# Patient Record
Sex: Female | Born: 1940 | Race: White | Hispanic: No
Health system: Southern US, Community
[De-identification: ages and names within clinical notes are randomized; demographics above are authoritative.]

---

## 2005-02-28 ENCOUNTER — Emergency Department: Payer: Self-pay | Admitting: Emergency Medicine

## 2005-03-13 ENCOUNTER — Other Ambulatory Visit: Payer: Self-pay

## 2005-03-13 ENCOUNTER — Emergency Department: Payer: Self-pay | Admitting: Emergency Medicine

## 2005-04-15 ENCOUNTER — Ambulatory Visit: Payer: Self-pay | Admitting: *Deleted

## 2005-04-19 ENCOUNTER — Ambulatory Visit: Payer: Self-pay | Admitting: Internal Medicine

## 2005-05-04 ENCOUNTER — Emergency Department: Payer: Self-pay | Admitting: Emergency Medicine

## 2005-05-20 ENCOUNTER — Ambulatory Visit: Payer: Self-pay | Admitting: *Deleted

## 2005-07-18 ENCOUNTER — Ambulatory Visit (HOSPITAL_COMMUNITY): Admission: RE | Admit: 2005-07-18 | Discharge: 2005-07-19 | Payer: Self-pay | Admitting: Cardiology

## 2005-07-27 ENCOUNTER — Encounter: Payer: Self-pay | Admitting: Anesthesiology

## 2005-08-11 ENCOUNTER — Encounter: Payer: Self-pay | Admitting: Anesthesiology

## 2005-08-18 ENCOUNTER — Inpatient Hospital Stay (HOSPITAL_COMMUNITY): Admission: RE | Admit: 2005-08-18 | Discharge: 2005-08-20 | Payer: Self-pay | Admitting: Cardiology

## 2007-07-31 IMAGING — CT CT HEAD W/O CM
1 series · 15 of 30 positions shown, 19 images · IV contrast (agent unspecified)
Comparison: none

CLINICAL DATA: Code stroke patient.  
HEAD CT WITHOUT CONTRAST:
TECHNIQUE: Contiguous axial images were obtained from the base of the skull through the vertex according to standard protocol without contrast.

[Series 2: headseq 4.8 h45s · axial · 0.42mm/px · z∈[-146,-16]mm · 15 of 30 slices shown, 19 images]
[im 2/30  brain]
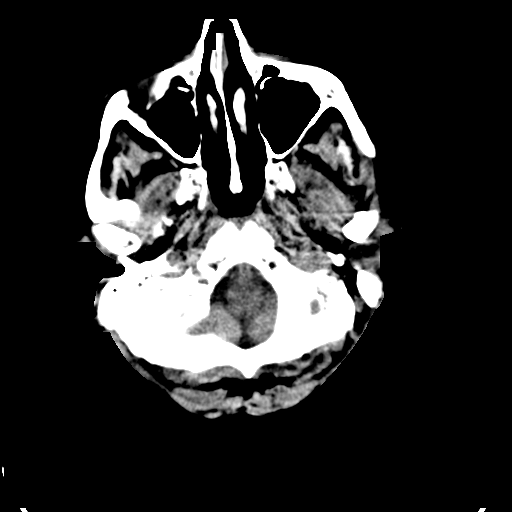
[im 2/30  bone]
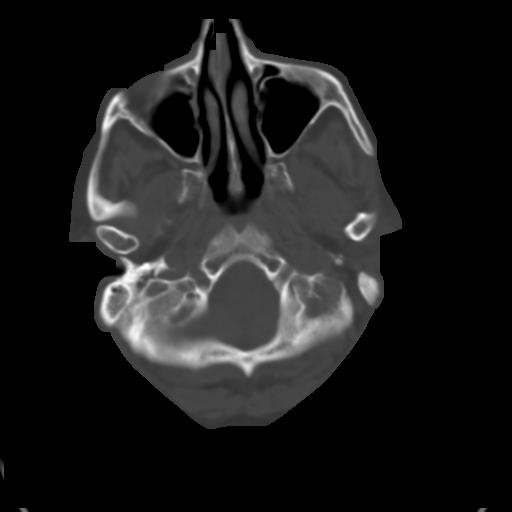
[im 4/30  brain]
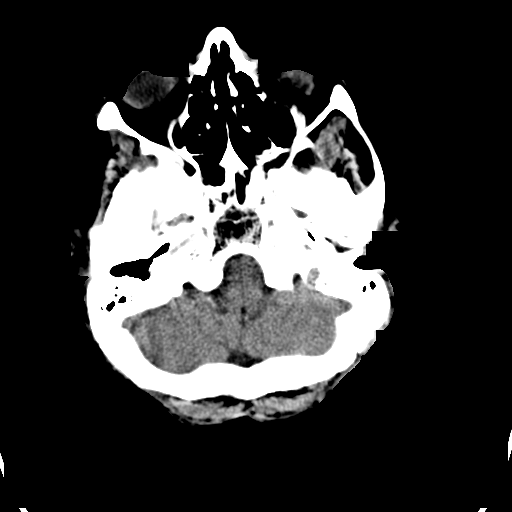
[im 6/30  brain]
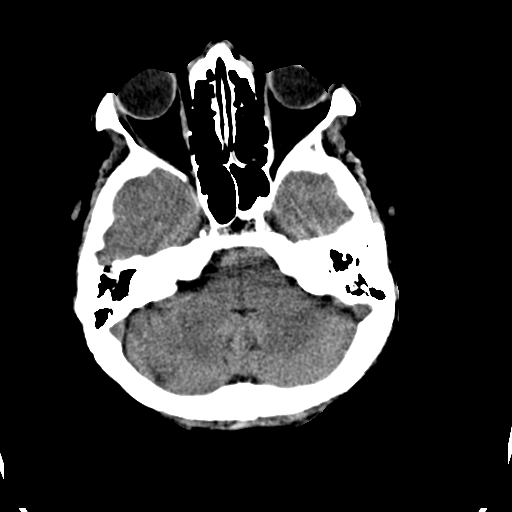
[im 8/30  brain]
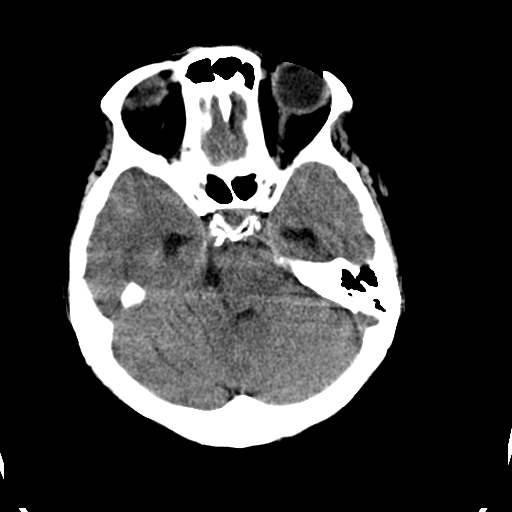
[im 10/30  brain]
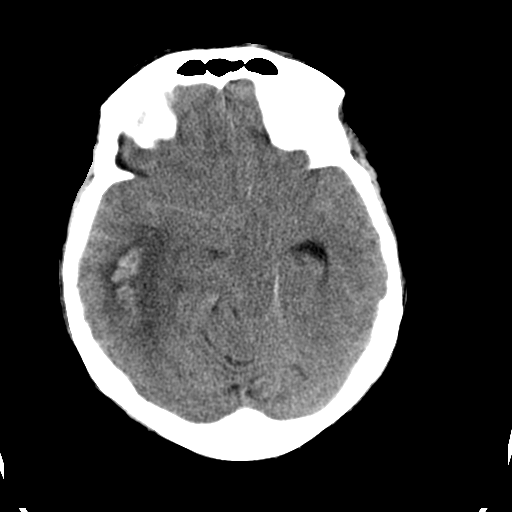
[im 10/30  bone]
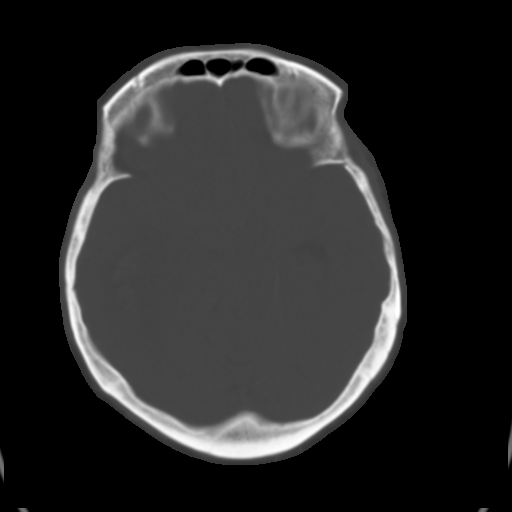
[im 12/30  brain]
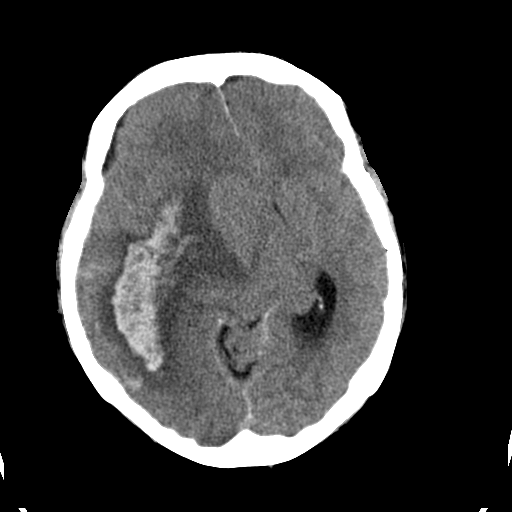
[im 14/30  brain]
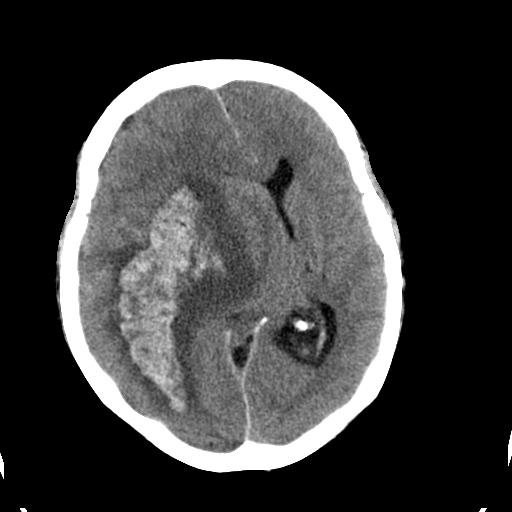
[im 16/30  brain]
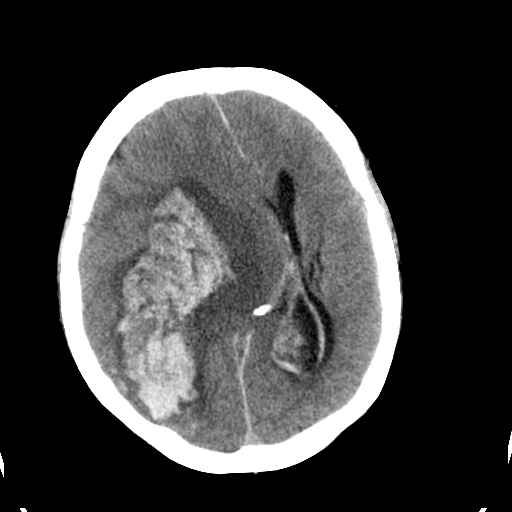
[im 17/30  brain]
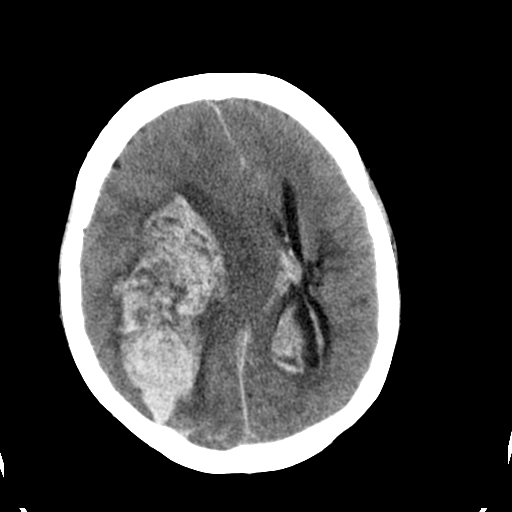
[im 17/30  bone]
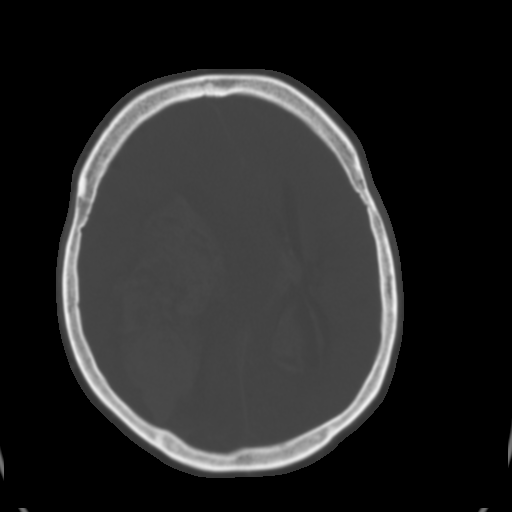
[im 19/30  brain]
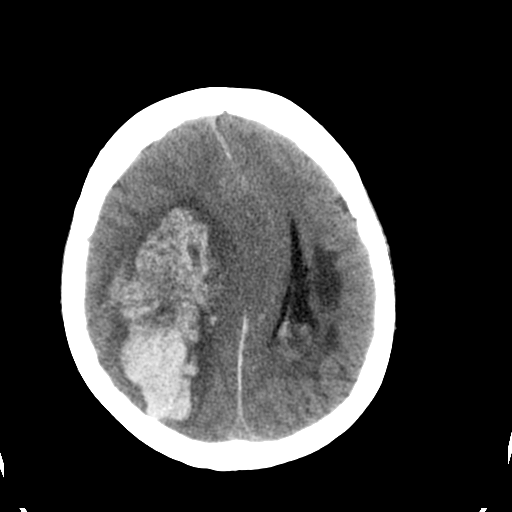
[im 21/30  brain]
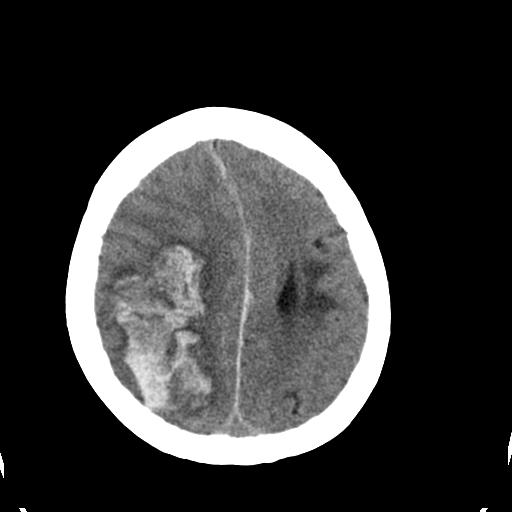
[im 23/30  brain]
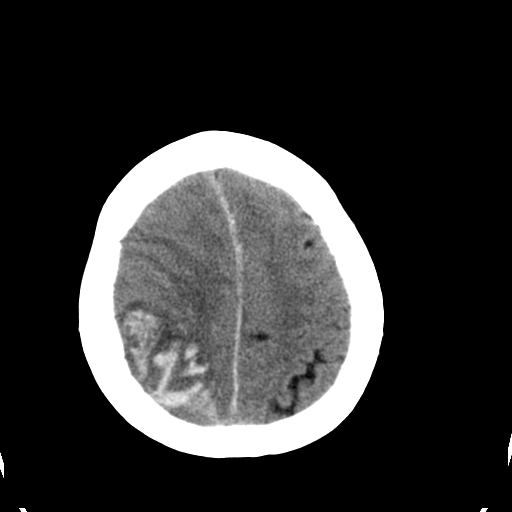
[im 25/30  brain]
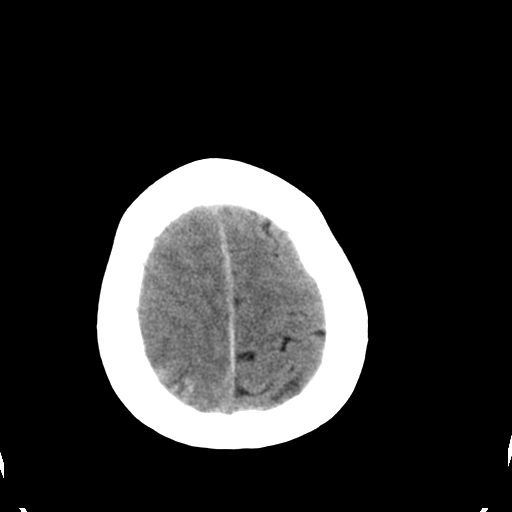
[im 25/30  bone]
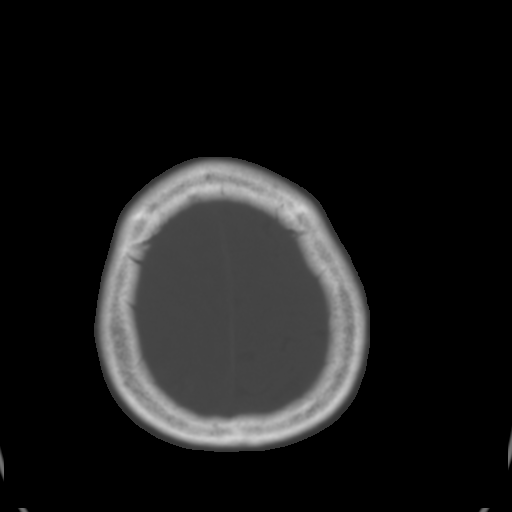
[im 27/30  brain]
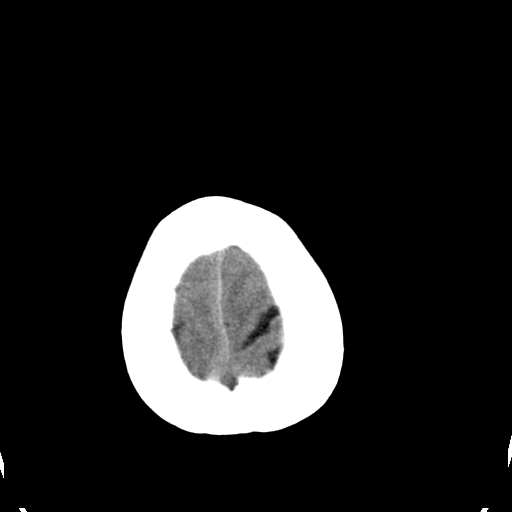
[im 29/30  brain]
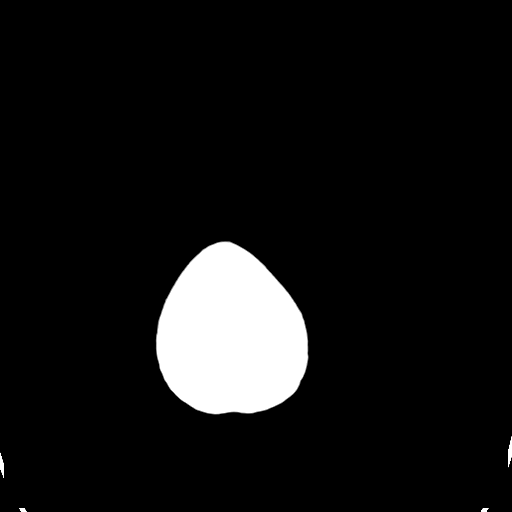

[15 of 30 positions shown; findings below may reference images not displayed]

FINDINGS: 4.8 x 10.0 cm right temporoparietal hematoma with extensive surrounding vasogenic edema.  There is likely involvement of a portion of the basal ganglia including external capsule.  The hematoma has penetrated into the ventricular system.   There is a cast of blood in the right lateral ventricle, primarily the occipital horn.  There is marked right to left mass effect.  Midline shift by approximately 3.3 cm.  Marked compression of the basilar cisterns and midbrain.  Findings compatible with descending transtentorial herniation, uncal herniation, and transfalcine herniation.  Dilated temporal horns and occipital horns secondary to trapping.  Encephalomalacic changes left caudate nuclear body and corona radiata consistent with previous CVA.
IMPRESSION: Large deep right temporoparietal hematoma with extensive involvement of a large portion of the right temporal and parietal lobes including primarily external capsule, insula, and operculum.  Marked midline shift.  Marked mass effect with brain and brainstem herniation.  Extensive right to left midline shift.  Reportedly the patient has a previous CVA on the left which is evident by encephalomalacic changes.

## 2016-01-05 ENCOUNTER — Telehealth: Payer: Self-pay | Admitting: *Deleted

## 2016-01-05 NOTE — Telephone Encounter (Signed)
A user error has taken place Open by mistake
